# Patient Record
Sex: Female | Born: 1960 | Race: Black or African American | Hispanic: No | Marital: Married | Smoking: Never smoker
Health system: Southern US, Community
[De-identification: ages and names within clinical notes are randomized; demographics above are authoritative.]

## PROBLEM LIST (undated history)

## (undated) DIAGNOSIS — K219 Gastro-esophageal reflux disease without esophagitis: Secondary | ICD-10-CM

## (undated) HISTORY — DX: Gastro-esophageal reflux disease without esophagitis: K21.9

## (undated) HISTORY — PX: OTHER SURGICAL HISTORY: SHX169

---

## 2003-07-16 ENCOUNTER — Ambulatory Visit (HOSPITAL_COMMUNITY): Admission: RE | Admit: 2003-07-16 | Discharge: 2003-07-16 | Payer: Self-pay | Admitting: Family Medicine

## 2005-02-26 ENCOUNTER — Ambulatory Visit (HOSPITAL_COMMUNITY): Admission: RE | Admit: 2005-02-26 | Discharge: 2005-02-26 | Payer: Self-pay | Admitting: Family Medicine

## 2006-03-15 IMAGING — CR DG LUMBAR SPINE COMPLETE 4+V
5 series · 5 of 5 positions shown · non-contrast
Comparison: none

CLINICAL DATA: Low back and sacral pain for approximately two weeks.  No injury.  
 LUMBOSACRAL SPINE COMPLETE:
 Five views of the lumbosacral spine including both oblique, AP, and lateral views show five typical lumbar segments with no evidence of fracture or dislocation.  The posterior elements appear to be intact as far as can be seen.  The sacroiliac and hip joints appear normal as far as can be seen.

[view not recorded (1 of 5)]
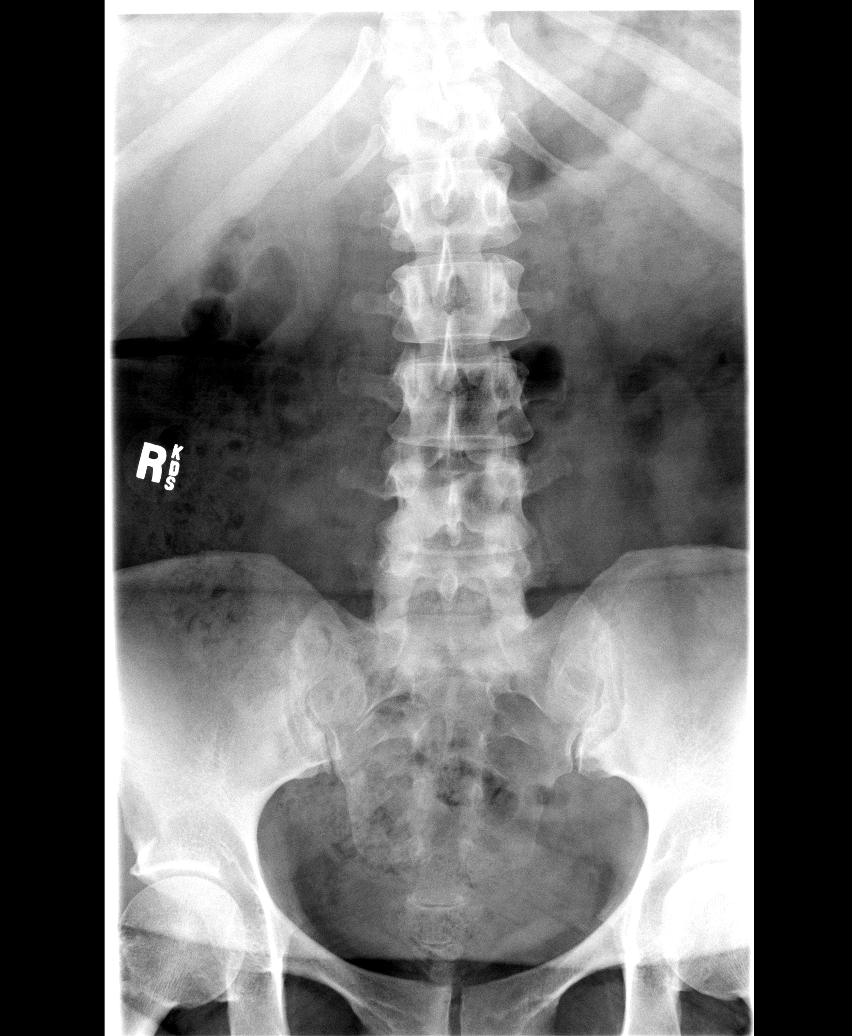

[view not recorded (2 of 5)]
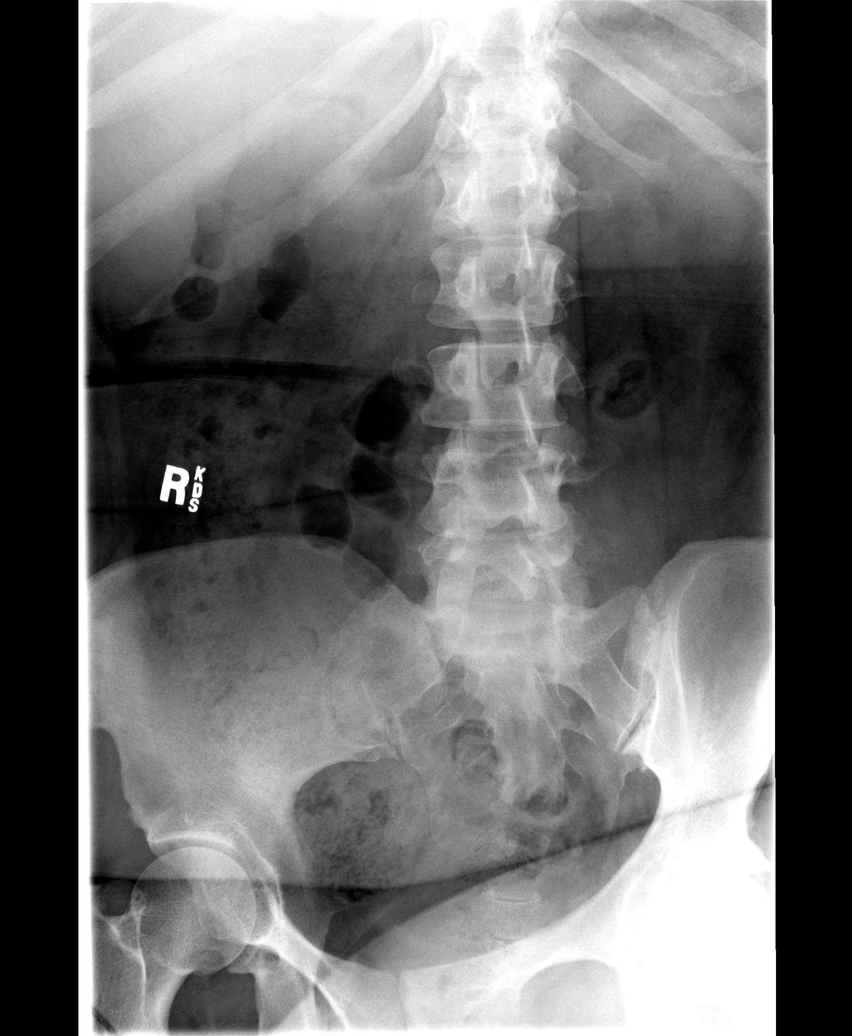

[view not recorded (3 of 5)]
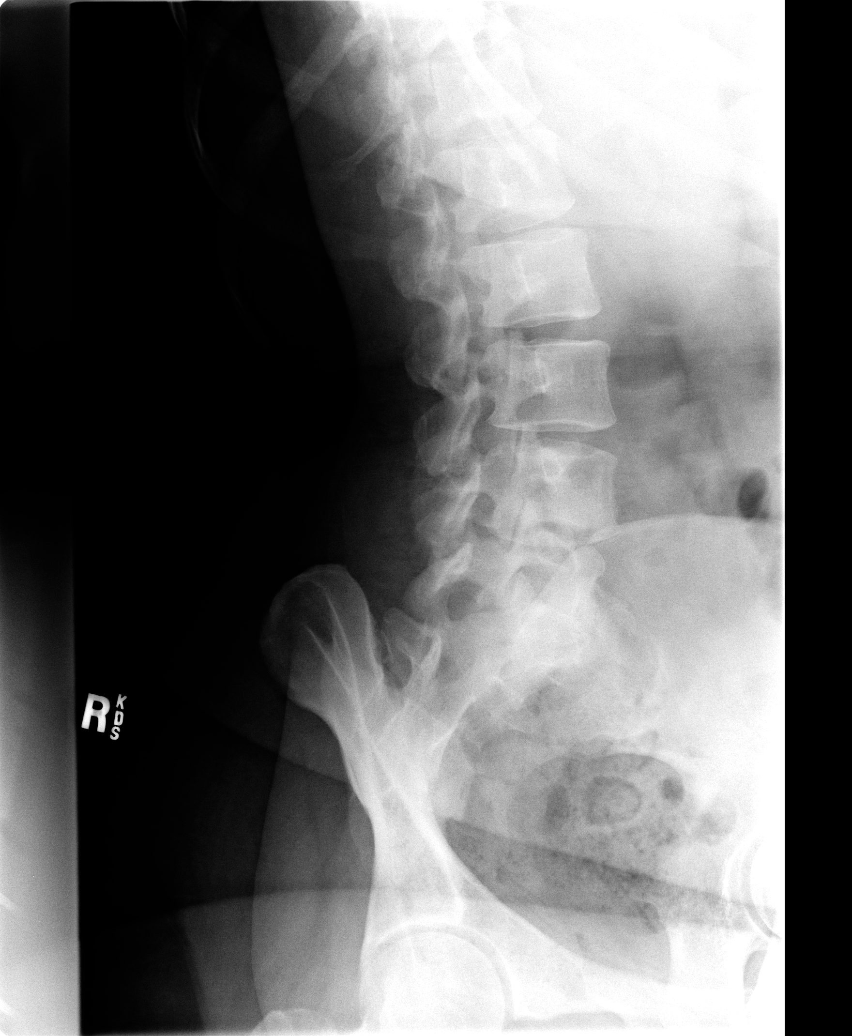

[view not recorded (4 of 5)]
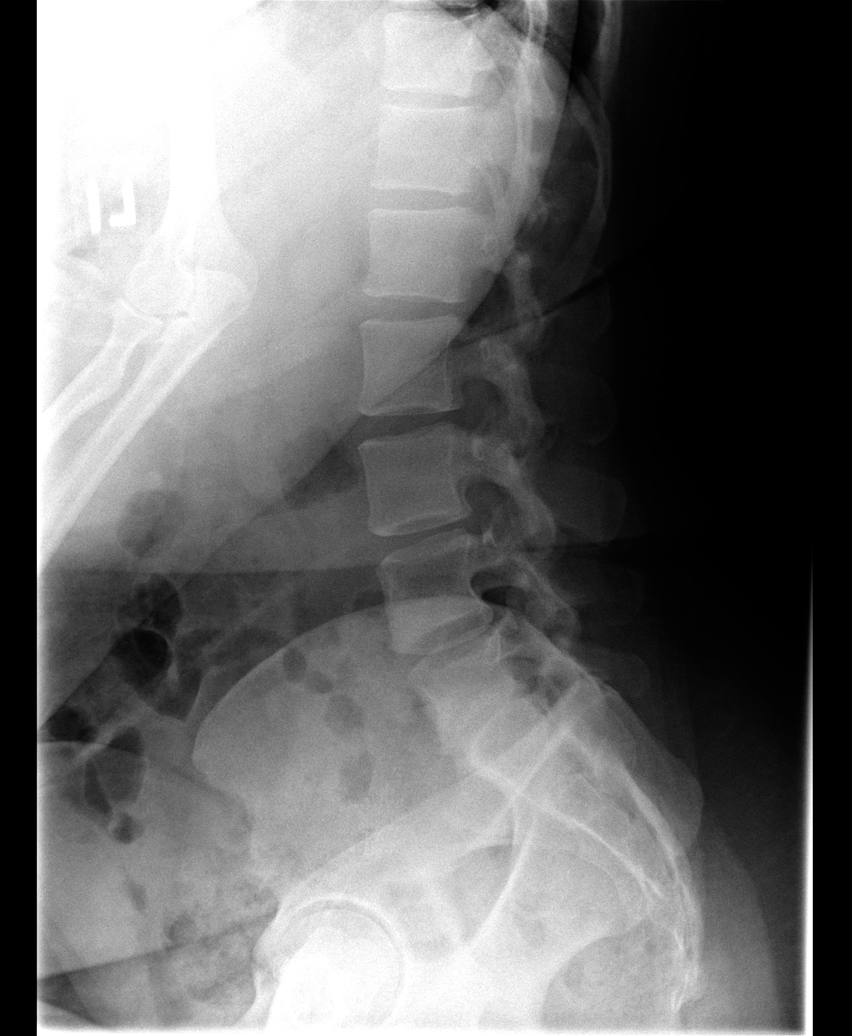

[view not recorded (5 of 5)]
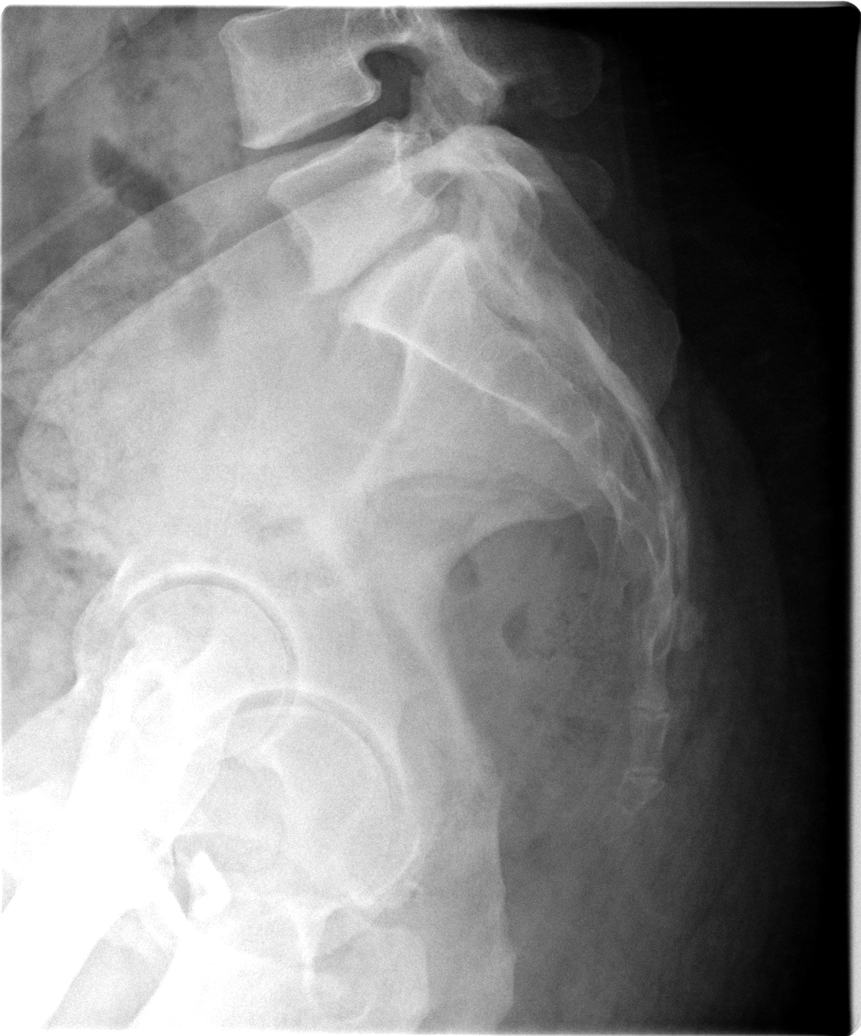

[5 of 5 positions shown; findings below may reference images not displayed]

IMPRESSION: Normal lumbosacral spine without previous films for comparison.

## 2011-02-20 ENCOUNTER — Encounter: Payer: Self-pay | Admitting: Gastroenterology

## 2011-03-01 ENCOUNTER — Ambulatory Visit: Payer: Self-pay | Admitting: Gastroenterology

## 2011-03-20 ENCOUNTER — Ambulatory Visit (INDEPENDENT_AMBULATORY_CARE_PROVIDER_SITE_OTHER): Payer: PRIVATE HEALTH INSURANCE | Admitting: Gastroenterology

## 2011-03-20 ENCOUNTER — Encounter: Payer: Self-pay | Admitting: Gastroenterology

## 2011-03-20 VITALS — BP 126/87 | HR 82 | Temp 96.7°F | Ht 65.0 in | Wt 260.2 lb

## 2011-03-20 DIAGNOSIS — R1319 Other dysphagia: Secondary | ICD-10-CM | POA: Insufficient documentation

## 2011-03-20 DIAGNOSIS — R1310 Dysphagia, unspecified: Secondary | ICD-10-CM | POA: Insufficient documentation

## 2011-03-20 DIAGNOSIS — Z1211 Encounter for screening for malignant neoplasm of colon: Secondary | ICD-10-CM | POA: Insufficient documentation

## 2011-03-20 DIAGNOSIS — R1314 Dysphagia, pharyngoesophageal phase: Secondary | ICD-10-CM

## 2011-03-20 DIAGNOSIS — K219 Gastro-esophageal reflux disease without esophagitis: Secondary | ICD-10-CM | POA: Insufficient documentation

## 2011-03-20 NOTE — Assessment & Plan Note (Signed)
Advised patient of new screening age of 59 for African Americans. She would like to postpone colonoscopy for now. She'll let us know when she is ready.

## 2011-03-20 NOTE — Progress Notes (Signed)
Primary Care Physician:  Harlow Asa, MD, MD  Primary Gastroenterologist:  Jonette Eva, MD  Chief Complaint  Patient presents with  . Dysphagia    intermittent for several years    HPI:  Stacy Olsen is a 50 y.o. female here several year history of intermittent solid food dysphagia. Dysphagia to meat. Feels fullness in center of chest. Forces it up with induced vomiting. Going on for few years and now more progressive. Citrus foods make it worse. Intermittent heartburn. Sometimes worse at night if eat late. Takes Prevacid 24-hour when necessary. BM regular. No melena, brbpr. No prior colonoscopy.    Current Outpatient Prescriptions  Medication Sig Dispense Refill  . acetaminophen (TYLENOL) 500 MG tablet Take 500 mg by mouth every 6 (six) hours as needed. As needed       . lansoprazole (PREVACID) 15 MG capsule Take 15 mg by mouth daily. As needed         Allergies as of 03/20/2011  . (No Known Allergies)    Past Medical History  Diagnosis Date  . GERD (gastroesophageal reflux disease)     Past Surgical History  Procedure Date  . None     Family History  Problem Relation Age of Onset  . Heart failure Mother   . Diabetes Mother   . Colon cancer Neg Hx   . Liver disease Neg Hx     History   Social History  . Marital Status: Married    Spouse Name: N/A    Number of Children: 0  . 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 Smoking status: Never Smoker   . Smokeless tobacco: Not on file  . Alcohol Use: 0.0 oz/week     mixed drinks occasionally  . Drug Use: No  . Sexually Active: Not on file   Other Topics Concern  . Not on file   Social History Narrative  . No narrative on file      ROS:  General: Negative for anorexia, weight loss, fever, chills, fatigue, weakness. Eyes: Negative for vision changes.  ENT: Negative for hoarseness. Recent nasal congestion resolved  post antibiotics. CV: Negative for chest pain, angina, palpitations, dyspnea on exertion, peripheral edema.  Respiratory: Negative for dyspnea at rest, dyspnea on exertion, cough, sputum, wheezing.  GI: See history of present illness. GU:  Negative for dysuria, hematuria, urinary incontinence, urinary frequency, nocturnal urination.  MS: Negative for joint pain, low back pain.  Derm: Negative for rash or itching.  Neuro: Negative for weakness, abnormal sensation, seizure, frequent headaches, memory loss, confusion.  Psych: Negative for anxiety, depression, suicidal ideation, hallucinations.  Endo: Negative for unusual weight change.  Heme: Negative for bruising or bleeding. Allergy: Negative for rash or hives.    Physical Examination:  BP 126/87  Pulse 82  Temp(Src) 96.7 F (35.9 C) (Tympanic)  Ht 5\' 5"  (1.651 m)  Wt 260 lb 3.2 oz (118.026 kg)  BMI 43.30 kg/m2   General: Well-nourished, well-developed in no acute distress. Obese. Head: Normocephalic, atraumatic.   Eyes: Conjunctiva pink, no icterus. Mouth: Oropharyngeal mucosa moist and pink , no lesions erythema or exudate. Neck: Supple without thyromegaly, masses, or lymphadenopathy.  Lungs: Clear to auscultation bilaterally.  Heart: Regular rate and rhythm, no murmurs rubs or gallops.  Abdomen: Bowel sounds are normal, nontender, nondistended, no hepatosplenomegaly or masses, no abdominal bruits or    hernia ,  no rebound or guarding.   Extremities: No lower extremity edema.  Neuro: Alert and oriented x 4 , grossly normal neurologically.  Skin: Warm and dry, no rash or jaundice.   Psych: Alert and cooperative, normal mood and affect.

## 2011-03-20 NOTE — Progress Notes (Signed)
Cc to PCP 

## 2011-03-20 NOTE — Assessment & Plan Note (Signed)
Solid food esophageal dysphagia likely due to ring or stricture. Recommend EGD with dilation in the near future. I have discussed the risks, alternatives, benefits with regards to but not limited to the risk of reaction to medication, bleeding, infection, perforation and the patient is agreeable to proceed. Written consent to be obtained.

## 2011-03-20 NOTE — Assessment & Plan Note (Signed)
Intermittent heartburn symptoms controlled on Prevacid over-the-counter when necessary.

## 2011-03-30 MED ORDER — SODIUM CHLORIDE 0.45 % IV SOLN
Freq: Once | INTRAVENOUS | Status: AC
  Administered 2011-04-02: 12:00:00 via INTRAVENOUS

## 2011-04-02 ENCOUNTER — Ambulatory Visit (HOSPITAL_COMMUNITY)
Admission: RE | Admit: 2011-04-02 | Discharge: 2011-04-02 | Disposition: A | Discharge status: Home / Self Care | Payer: 59 | Source: Ambulatory Visit | Attending: Gastroenterology | Admitting: Gastroenterology

## 2011-04-02 ENCOUNTER — Encounter: Payer: PRIVATE HEALTH INSURANCE | Admitting: Gastroenterology

## 2011-04-02 ENCOUNTER — Encounter (HOSPITAL_COMMUNITY)
Admission: RE | Disposition: A | Discharge status: Home / Self Care | Payer: Self-pay | Source: Ambulatory Visit | Attending: Gastroenterology

## 2011-04-02 ENCOUNTER — Other Ambulatory Visit: Payer: Self-pay | Admitting: Gastroenterology

## 2011-04-02 DIAGNOSIS — K2990 Gastroduodenitis, unspecified, without bleeding: Secondary | ICD-10-CM

## 2011-04-02 DIAGNOSIS — K219 Gastro-esophageal reflux disease without esophagitis: Secondary | ICD-10-CM | POA: Insufficient documentation

## 2011-04-02 DIAGNOSIS — K2970 Gastritis, unspecified, without bleeding: Secondary | ICD-10-CM

## 2011-04-02 DIAGNOSIS — R1310 Dysphagia, unspecified: Secondary | ICD-10-CM | POA: Insufficient documentation

## 2011-04-02 DIAGNOSIS — K2980 Duodenitis without bleeding: Secondary | ICD-10-CM | POA: Insufficient documentation

## 2011-04-02 DIAGNOSIS — K222 Esophageal obstruction: Secondary | ICD-10-CM

## 2011-04-02 DIAGNOSIS — K209 Esophagitis, unspecified without bleeding: Secondary | ICD-10-CM

## 2011-04-02 HISTORY — PX: ESOPHAGOGASTRODUODENOSCOPY: SHX5428

## 2011-04-02 HISTORY — PX: SAVORY DILATION: SHX5439

## 2011-04-02 SURGERY — EGD (ESOPHAGOGASTRODUODENOSCOPY)
Anesthesia: Moderate Sedation

## 2011-04-02 MED ORDER — BUTAMBEN-TETRACAINE-BENZOCAINE 2-2-14 % EX AERO
INHALATION_SPRAY | CUTANEOUS | Status: DC | PRN
  Administered 2011-04-02: 2 via TOPICAL

## 2011-04-02 MED ORDER — MINERAL OIL PO OIL
TOPICAL_OIL | ORAL | Status: AC
  Filled 2011-04-02: qty 30

## 2011-04-02 MED ORDER — MIDAZOLAM HCL 5 MG/5ML IJ SOLN
INTRAMUSCULAR | Status: DC | PRN
  Administered 2011-04-02 (×2): 1 mg via INTRAVENOUS
  Administered 2011-04-02 (×2): 2 mg via INTRAVENOUS
  Administered 2011-04-02: 1 mg via INTRAVENOUS

## 2011-04-02 MED ORDER — MEPERIDINE HCL 100 MG/ML IJ SOLN
INTRAMUSCULAR | Status: AC
  Filled 2011-04-02: qty 2

## 2011-04-02 MED ORDER — MEPERIDINE HCL 100 MG/ML IJ SOLN
INTRAMUSCULAR | Status: DC | PRN
  Administered 2011-04-02: 25 mg via INTRAVENOUS
  Administered 2011-04-02 (×2): 50 mg via INTRAVENOUS

## 2011-04-02 MED ORDER — LANSOPRAZOLE 30 MG PO CPDR: DELAYED_RELEASE_CAPSULE | ORAL | Status: DC

## 2011-04-02 MED ORDER — MIDAZOLAM HCL 5 MG/5ML IJ SOLN
INTRAMUSCULAR | Status: AC
  Filled 2011-04-02: qty 10

## 2011-04-02 NOTE — Interval H&P Note (Signed)
History and Physical Interval Note:   04/02/2011   11:46 AM   Stacy Olsen  has presented today for surgery, with the diagnosis of dysphagia, gerd  The various methods of treatment have been discussed with the patient and family. After consideration of risks, benefits and other options for treatment, the patient has consented to  Procedure(s): ESOPHAGOGASTRODUODENOSCOPY (EGD) MALONEY DILATION SAVORY DILATION as a surgical intervention .  I have reviewed the patients' chart and labs.  Questions were answered to the patient's satisfaction.     Jonette Eva  MD

## 2011-04-02 NOTE — H&P (Signed)
Diagnoses     Esophageal dysphagia   - Primary    787.24    GERD (gastroesophageal reflux disease)     530.81    Colon cancer screening     V76.51      Reason for Visit     Dysphagia    intermittent for several years        Current Vitals       Recorded User        03/20/2011  8:14 AM  Trudee Kuster, LPN           BP Pulse Temp (Src) Resp Ht Wt    126/87  82  96.7 F (35.9 C) (Tympanic)  N/A  5\' 5"  (1.651 m)  118.026 kg (260 lb 3.2 oz)       BMI SpO2 PF LMP    43.30 kg/m2  N/A  N/A  N/A          Progress Notes     Tana Coast, PA  03/20/2011  8:56 AM  Signed Primary Care Physician:  Harlow Asa, MD, MD   Primary Gastroenterologist:  Jonette Eva, MD    Chief Complaint   Patient presents with   .  Dysphagia       intermittent for several years      HPI:  Stacy Olsen is a 50 y.o. female here several year history of intermittent solid food dysphagia. Dysphagia to meat. Feels fullness in center of chest. Forces it up with induced vomiting. Going on for few years and now more progressive. Citrus foods make it worse. Intermittent heartburn. Sometimes worse at night if eat late. Takes Prevacid 24-hour when necessary. BM regular. No melena, brbpr. No prior colonoscopy.       Current Outpatient Prescriptions   Medication  Sig  Dispense  Refill   .  acetaminophen (TYLENOL) 500 MG tablet  Take 500 mg by mouth every 6 (six) hours as needed. As needed          .  lansoprazole (PREVACID) 15 MG capsule  Take 15 mg by mouth daily. As needed              Allergies as of 03/20/2011   .  (No Known Allergies)       Past Medical History   Diagnosis  Date   .  GERD (gastroesophageal reflux disease)         Past Surgical History   Procedure  Date   .  None         Family History   Problem  Relation  Age of Onset   .  Heart failure  Mother     .  Diabetes  Mother     .  Colon cancer  Neg Hx     .  Liver disease  Neg Hx         History       Social  History   .  Marital Status:  Married       Spouse Name:  N/A       Number of Children:  0   .  Years of Education:  N/A       Occupational History   .  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  Smoking status:  Never Smoker    .  Smokeless tobacco:  Not on file   .  Alcohol Use:  0.0 oz/week         mixed drinks occasionally   .  Drug Use:  No   .  Sexually Active:  Not on file       Other Topics  Concern   .  Not on file       Social History Narrative   .  No narrative on file        ROS:   General: Negative for anorexia, weight loss, fever, chills, fatigue, weakness. Eyes: Negative for vision changes.   ENT: Negative for hoarseness. Recent nasal congestion resolved post antibiotics. CV: Negative for chest pain, angina, palpitations, dyspnea on exertion, peripheral edema.   Respiratory: Negative for dyspnea at rest, dyspnea on exertion, cough, sputum, wheezing.   GI: See history of present illness. GU:  Negative for dysuria, hematuria, urinary incontinence, urinary frequency, nocturnal urination.   MS: Negative for joint pain, low back pain.   Derm: Negative for rash or itching.   Neuro: Negative for weakness, abnormal sensation, seizure, frequent headaches, memory loss, confusion.   Psych: Negative for anxiety, depression, suicidal ideation, hallucinations.   Endo: Negative for unusual weight change.   Heme: Negative for bruising or bleeding. Allergy: Negative for rash or hives.     Physical Examination:   BP 126/87  Pulse 82  Temp(Src) 96.7 F (35.9 C) (Tympanic)  Ht 5\' 5"  (1.651 m)  Wt 260 lb 3.2 oz (118.026 kg)  BMI 43.30 kg/m2    General: Well-nourished, well-developed in no acute distress. Obese. Head: Normocephalic, atraumatic.    Eyes: Conjunctiva pink, no icterus. Mouth: Oropharyngeal mucosa moist and pink , no lesions erythema or exudate. Neck: Supple without thyromegaly, masses, or  lymphadenopathy.   Lungs: Clear to auscultation bilaterally.   Heart: Regular rate and rhythm, no murmurs rubs or gallops.   Abdomen: Bowel sounds are normal, nontender, nondistended, no hepatosplenomegaly or masses, no abdominal bruits or    hernia , no rebound or guarding.    Extremities: No lower extremity edema.   Neuro: Alert and oriented x 4 , grossly normal neurologically.   Skin: Warm and dry, no rash or jaundice.    Psych: Alert and cooperative, normal mood and affect.          Glendora Score  03/20/2011 11:39 AM  Signed Cc to PCP        Esophageal dysphagia - Tana Coast, PA  03/20/2011  8:39 AM  Signed Solid food esophageal dysphagia likely due to ring or stricture. Recommend EGD with dilation in the near future. I have discussed the risks, alternatives, benefits with regards to but not limited to the risk of reaction to medication, bleeding, infection, perforation and the patient is agreeable to proceed. Written consent to be obtained.     GERD (gastroesophageal reflux disease) - Tana Coast, PA  03/20/2011  8:39 AM  Signed Intermittent heartburn symptoms controlled on Prevacid over-the-counter when necessary.  Colon cancer screening - Tana Coast, PA  03/20/2011  8:40 AM  Signed Advised patient of new screening age of 73 for African Americans. She would like to postpone colonoscopy for now. She'll let us know when she is ready.

## 2011-04-04 ENCOUNTER — Telehealth: Payer: Self-pay | Admitting: Gastroenterology

## 2011-04-04 NOTE — Telephone Encounter (Signed)
Please call pt. Her biopsy of the stomach showed gastritis. Continue BID Prevacid. EGD/dil in AUG 2012. OPV in [facility] 2012.

## 2011-04-04 NOTE — Telephone Encounter (Signed)
EGD/an f/u OPV is in the computer

## 2011-04-04 NOTE — Telephone Encounter (Signed)
Cc path to PCP 

## 2011-04-05 NOTE — Telephone Encounter (Signed)
LMOM to call.

## 2011-04-05 NOTE — Progress Notes (Signed)
Agree. 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

## 2011-04-09 ENCOUNTER — Encounter (HOSPITAL_COMMUNITY): Payer: Self-pay | Admitting: Gastroenterology

## 2011-04-09 NOTE — Telephone Encounter (Signed)
Pt informed. York Spaniel she will be taking a cruise week of Aug. 16-21. Please schedule a different week.

## 2011-04-09 NOTE — Telephone Encounter (Signed)
LMOM to call.

## 2011-05-08 ENCOUNTER — Telehealth: Payer: Self-pay

## 2011-05-09 ENCOUNTER — Telehealth: Payer: Self-pay

## 2011-05-09 NOTE — Telephone Encounter (Signed)
LMOM to call. Need to reschedule EGD.

## 2011-05-09 NOTE — Telephone Encounter (Signed)
Mailing a letter to call.  

## 2011-05-09 NOTE — Telephone Encounter (Signed)
LMOM to call. Mailing a letter also.

## 2011-06-08 ENCOUNTER — Telehealth: Payer: Self-pay

## 2011-06-08 NOTE — Telephone Encounter (Signed)
Pt called and asked for written prescription for a 3 month supply of Lansoprazole, and she would pick up the Rx. After reviewing her chart, she was supposed to have been scheduled for an EGD, I called and LMOM for her to call for OV and she could get the Prescription written at her OV.

## 2011-06-11 NOTE — Telephone Encounter (Signed)
Noted  

## 2011-06-11 NOTE — Telephone Encounter (Signed)
LMOM at home again that she needs to schedule OV. (Work Number incorrect )

## 2011-06-25 ENCOUNTER — Encounter: Payer: Self-pay | Admitting: Gastroenterology

## 2011-06-25 ENCOUNTER — Ambulatory Visit (INDEPENDENT_AMBULATORY_CARE_PROVIDER_SITE_OTHER): Payer: 59 | Admitting: Gastroenterology

## 2011-06-25 ENCOUNTER — Telehealth: Payer: Self-pay

## 2011-06-25 VITALS — BP 122/81 | HR 82 | Temp 97.9°F | Ht 65.0 in | Wt 265.2 lb

## 2011-06-25 DIAGNOSIS — R1319 Other dysphagia: Secondary | ICD-10-CM

## 2011-06-25 DIAGNOSIS — Z1211 Encounter for screening for malignant neoplasm of colon: Secondary | ICD-10-CM

## 2011-06-25 DIAGNOSIS — R1314 Dysphagia, pharyngoesophageal phase: Secondary | ICD-10-CM

## 2011-06-25 DIAGNOSIS — R1310 Dysphagia, unspecified: Secondary | ICD-10-CM

## 2011-06-25 NOTE — Progress Notes (Signed)
Primary Care Physician: Harlow Asa, MD, MD  Primary Gastroenterologist:  Jonette Eva, MD   Chief Complaint  Patient presents with  . Medication Refill    HPI: Stacy Olsen is a 50 y.o. female here for f/u. EGD/ED done in 03/2011. She had distal esophagitis with stricture, gastritis/duodenitis/small gastric ulcers. Bx showed reactive gastropathy likely secondary to nsaids. Patient denies further ASA powders. Aleve geltab about twice per week. Does not want another EGD right now. Still paying off bill. States she has no further problems swallowing. No heartburn. No n/v, abd pain, constipation, diarrhea, melena, brbpr.   Current Outpatient Prescriptions  Medication Sig Dispense Refill  . acetaminophen (TYLENOL) 500 MG tablet Take 500 mg by mouth every 6 (six) hours as needed. As needed       . lansoprazole (PREVACID) 30 MG capsule 1 po 30 minutes prior to meals twice daily  60 capsule  11    Allergies as of 06/25/2011  . (No Known Allergies)    ROS:  General: Negative for anorexia, weight loss, fever, chills, fatigue, weakness. ENT: Negative for hoarseness, difficulty swallowing , nasal congestion. CV: Negative for chest pain, angina, palpitations, dyspnea on exertion, peripheral edema.  Respiratory: Negative for dyspnea at rest, dyspnea on exertion, cough, sputum, wheezing.  GI: See history of present illness. GU:  Negative for dysuria, hematuria, urinary incontinence, urinary frequency, nocturnal urination.  Endo: Negative for unusual weight change.    Physical Examination:   BP 122/81  Pulse 82  Temp(Src) 97.9 F (36.6 C) (Temporal)  Ht 5\' 5"  (1.651 m)  Wt 265 lb 3.2 oz (120.294 kg)  BMI 44.13 kg/m2  General: Well-nourished, well-developed in no acute distress.  Eyes: No icterus. Mouth: Oropharyngeal mucosa moist and pink , no lesions erythema or exudate. Lungs: Clear to auscultation bilaterally.  Heart: Regular rate and rhythm, no murmurs rubs or gallops.    Abdomen: Bowel sounds are normal, nontender, nondistended, no hepatosplenomegaly or masses, no abdominal bruits or hernia , no rebound or guarding.   Extremities: No lower extremity edema. No clubbing or deformities. Neuro: Alert and oriented x 4   Skin: Warm and dry, no jaundice.   Psych: Alert and cooperative, normal mood and affect.

## 2011-06-25 NOTE — Patient Instructions (Addendum)
Please call us with your mail-order information to send off for Prevacid.  Avoid NSAIDS as much as possible. Office visit with Dr. Darrick Penna in 3 months.

## 2011-06-25 NOTE — Telephone Encounter (Signed)
Pt called and said the phone number for her mail order medications is [phone].

## 2011-06-25 NOTE — Assessment & Plan Note (Signed)
Clinically doing well. On double dose prevacid for now. Plan was for repeat dilation in 04/2011. Patient declines at this time. She denies difficulty swallowing. States all of her symptoms have resolved. Continue PPI BID for three months. OV with Dr. Darrick Penna in three months. She should call sooner if she has recurrent dysphagia.

## 2011-06-27 MED ORDER — LANSOPRAZOLE 30 MG PO CPDR: DELAYED_RELEASE_CAPSULE | ORAL | Status: DC

## 2011-06-27 NOTE — Progress Notes (Signed)
Cc to PCP 

## 2011-06-27 NOTE — Telephone Encounter (Signed)
RX done, please fax.

## 2011-06-27 NOTE — Telephone Encounter (Signed)
Faxed Rx

## 2011-07-26 ENCOUNTER — Encounter: Payer: Self-pay | Admitting: Gastroenterology

## 2011-08-09 NOTE — Progress Notes (Signed)
REVIEWED.  

## 2011-09-10 ENCOUNTER — Encounter: Payer: Self-pay | Admitting: Gastroenterology

## 2013-09-24 ENCOUNTER — Encounter (HOSPITAL_COMMUNITY): Payer: Self-pay | Admitting: Emergency Medicine

## 2013-09-24 ENCOUNTER — Emergency Department (HOSPITAL_COMMUNITY)
Admission: EM | Admit: 2013-09-24 | Discharge: 2013-09-24 | Disposition: A | Discharge status: Home / Self Care | Payer: No Typology Code available for payment source | Attending: Emergency Medicine | Admitting: Emergency Medicine

## 2013-09-24 ENCOUNTER — Emergency Department (HOSPITAL_COMMUNITY): Payer: No Typology Code available for payment source

## 2013-09-24 DIAGNOSIS — IMO0002 Reserved for concepts with insufficient information to code with codable children: Secondary | ICD-10-CM | POA: Insufficient documentation

## 2013-09-24 DIAGNOSIS — Z79899 Other long term (current) drug therapy: Secondary | ICD-10-CM | POA: Insufficient documentation

## 2013-09-24 DIAGNOSIS — K219 Gastro-esophageal reflux disease without esophagitis: Secondary | ICD-10-CM | POA: Insufficient documentation

## 2013-09-24 DIAGNOSIS — S161XXA Strain of muscle, fascia and tendon at neck level, initial encounter: Secondary | ICD-10-CM

## 2013-09-24 DIAGNOSIS — S139XXA Sprain of joints and ligaments of unspecified parts of neck, initial encounter: Secondary | ICD-10-CM | POA: Insufficient documentation

## 2013-09-24 DIAGNOSIS — Y92410 Unspecified street and highway as the place of occurrence of the external cause: Secondary | ICD-10-CM | POA: Insufficient documentation

## 2013-09-24 DIAGNOSIS — Y9389 Activity, other specified: Secondary | ICD-10-CM | POA: Insufficient documentation

## 2013-09-24 MED ORDER — CYCLOBENZAPRINE HCL 10 MG PO TABS: 10.0000 mg | ORAL_TABLET | Freq: Two times a day (BID) | ORAL | Status: AC | PRN

## 2013-09-24 MED ORDER — HYDROCODONE-ACETAMINOPHEN 5-325 MG PO TABS: 2.0000 | ORAL_TABLET | ORAL | Status: AC | PRN
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
## 2013-09-24 NOTE — Discharge Instructions (Signed)
Cervical Sprain A cervical sprain is an injury in the neck in which the strong, fibrous tissues (ligaments) that connect your neck bones stretch or tear. Cervical sprains can range from mild to severe. Severe cervical sprains can cause the neck vertebrae to be unstable. This can lead to damage of the spinal cord and can result in serious nervous system problems. The amount of time it takes for a cervical sprain to get better depends on the cause and extent of the injury. Most cervical sprains heal in 1 to 3 weeks. CAUSES  Severe cervical sprains may be caused by:   Contact sport injuries (such as from football, rugby, wrestling, hockey, auto racing, gymnastics, diving, martial arts, or boxing).   Motor vehicle collisions.   Whiplash injuries. This is an injury from a sudden forward-and backward whipping movement of the head and neck.  Falls.  Mild cervical sprains may be caused by:   Being in an awkward position, such as while cradling a telephone between your ear and shoulder.   Sitting in a chair that does not offer proper support.   Working at a poorly designed computer station.   Looking up or down for long periods of time.  SYMPTOMS   Pain, soreness, stiffness, or a burning sensation in the front, back, or sides of the neck. This discomfort may develop immediately after the injury or slowly, 24 hours or more after the injury.   Pain or tenderness directly in the middle of the back of the neck.   Shoulder or upper back pain.   Limited ability to move the neck.   Headache.   Dizziness.   Weakness, numbness, or tingling in the hands or arms.   Muscle spasms.   Difficulty swallowing or chewing.   Tenderness and swelling of the neck.  DIAGNOSIS  Most of the time your health care provider can diagnose a cervical sprain by taking your history and doing a physical exam. Your health care provider will ask about previous neck injuries and any known neck  problems, such as arthritis in the neck. X-rays may be taken to find out if there are any other problems, such as with the bones of the neck. Other tests, such as a CT scan or MRI, may also be needed.  TREATMENT  Treatment depends on the severity of the cervical sprain. Mild sprains can be treated with rest, keeping the neck in place (immobilization), and pain medicines. Severe cervical sprains are immediately immobilized. Further treatment is done to help with pain, muscle spasms, and other symptoms and may include:  Medicines, such as pain relievers, numbing medicines, or muscle relaxants.   Physical therapy. This may involve stretching exercises, strengthening exercises, and posture training. Exercises and improved posture can help stabilize the neck, strengthen muscles, and help stop symptoms from returning.  HOME CARE INSTRUCTIONS   Put ice on the injured area.   Put ice in a plastic bag.   Place a towel between your skin and the bag.   Leave the ice on for 15 20 minutes, 3 4 times a day.   If your injury was severe, you may have been given a cervical collar to wear. A cervical collar is a two-piece collar designed to keep your neck from moving while it heals.  Do not remove the collar unless instructed by your health care provider.  If you have long hair, keep it outside of the collar.  Ask your health care provider before making any adjustments to your collar.   Minor adjustments may be required over time to improve comfort and reduce pressure on your chin or on the back of your head.  Ifyou are allowed to remove the collar for cleaning or bathing, follow your health care provider's instructions on how to do so safely.  Keep your collar clean by wiping it with mild soap and water and drying it completely. If the collar you have been given includes removable pads, remove them every 1 2 days and hand wash them with soap and water. Allow them to air dry. They should be completely  dry before you wear them in the collar.  If you are allowed to remove the collar for cleaning and bathing, wash and dry the skin of your neck. Check your skin for irritation or sores. If you see any, tell your health care provider.  Do not drive while wearing the collar.   Only take over-the-counter or prescription medicines for pain, discomfort, or fever as directed by your health care provider.   Keep all follow-up appointments as directed by your health care provider.   Keep all physical therapy appointments as directed by your health care provider.   Make any needed adjustments to your workstation to promote good posture.   Avoid positions and activities that make your symptoms worse.   Warm up and stretch before being active to help prevent problems.  SEEK MEDICAL CARE IF:   Your pain is not controlled with medicine.   You are unable to decrease your pain medicine over time as planned.   Your activity level is not improving as expected.  SEEK IMMEDIATE MEDICAL CARE IF:   You develop any bleeding.  You develop stomach upset.  You have signs of an allergic reaction to your medicine.   Your symptoms get worse.   You develop new, unexplained symptoms.   You have numbness, tingling, weakness, or paralysis in any part of your body.  MAKE SURE YOU:   Understand these instructions.  Will watch your condition.  Will get help right away if you are not doing well or get worse. Document Released: 06/24/2007 Document Revised: 06/17/2013 Document Reviewed: 03/04/2013 [employer] Patient Information 2014 [employer].  
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
## 2017-11-08 DIAGNOSIS — J0100 Acute maxillary sinusitis, unspecified: Secondary | ICD-10-CM | POA: Diagnosis not present

## 2017-11-08 DIAGNOSIS — Z1389 Encounter for screening for other disorder: Secondary | ICD-10-CM | POA: Diagnosis not present

## 2017-11-08 DIAGNOSIS — Z6841 Body Mass Index (BMI) 40.0 and over, adult: Secondary | ICD-10-CM | POA: Diagnosis not present

## 2017-11-08 DIAGNOSIS — J3089 Other allergic rhinitis: Secondary | ICD-10-CM | POA: Diagnosis not present

## 2018-03-05 DIAGNOSIS — Z6841 Body Mass Index (BMI) 40.0 and over, adult: Secondary | ICD-10-CM | POA: Diagnosis not present

## 2018-03-05 DIAGNOSIS — Z1389 Encounter for screening for other disorder: Secondary | ICD-10-CM | POA: Diagnosis not present

## 2018-03-05 DIAGNOSIS — J301 Allergic rhinitis due to pollen: Secondary | ICD-10-CM | POA: Diagnosis not present

## 2018-03-05 DIAGNOSIS — J019 Acute sinusitis, unspecified: Secondary | ICD-10-CM | POA: Diagnosis not present

## 2019-11-08 ENCOUNTER — Other Ambulatory Visit: Payer: Self-pay

## 2019-11-08 ENCOUNTER — Ambulatory Visit: Payer: PRIVATE HEALTH INSURANCE | Attending: Internal Medicine

## 2019-11-08 DIAGNOSIS — Z23 Encounter for immunization: Secondary | ICD-10-CM | POA: Insufficient documentation

## 2019-11-08 NOTE — Progress Notes (Signed)
   Covid-19 Vaccination Clinic  Name:  Stacy Olsen    MRN: [id number] DOB: 1961-08-15  11/08/2019  Stacy Olsen was observed post Covid-19 immunization for 15 minutes without incidence. She was provided with Vaccine Information Sheet and instruction to access the V-Safe system.   Stacy Olsen was instructed to call 911 with any severe reactions post vaccine: Marland Kitchen Difficulty breathing  . Swelling of your face and throat  . A fast heartbeat  . A bad rash all over your body  . Dizziness and weakness    Immunizations Administered    Name Date Dose VIS Date Route   Moderna COVID-19 Vaccine 11/08/2019  2:26 PM 0.5 mL 08/11/2019 Intramuscular   Manufacturer: Moderna   Lot: [id number]   NDC: 27614-709-29

## 2019-12-12 ENCOUNTER — Ambulatory Visit: Payer: PRIVATE HEALTH INSURANCE | Attending: Internal Medicine

## 2019-12-12 DIAGNOSIS — Z23 Encounter for immunization: Secondary | ICD-10-CM

## 2019-12-12 NOTE — Progress Notes (Signed)
   Covid-19 Vaccination Clinic  Name:  Stacy Olsen    MRN: [id number] DOB: 14-Jun-1961  12/12/2019  Stacy Olsen was observed post Covid-19 immunization for 15 minutes without incident. She was provided with Vaccine Information Sheet and instruction to access the V-Safe system.   Stacy Olsen was instructed to call 911 with any severe reactions post vaccine: Marland Kitchen Difficulty breathing  . Swelling of face and throat  . A fast heartbeat  . A bad rash all over body  . Dizziness and weakness   Immunizations Administered    Name Date Dose VIS Date Route   Moderna COVID-19 Vaccine 12/12/2019  9:43 AM 0.5 mL 08/11/2019 Intramuscular   Manufacturer: Moderna   Lot: [id number]   NDC: 00923-300-76

## 2021-03-24 DIAGNOSIS — Z20822 Contact with and (suspected) exposure to covid-19: Secondary | ICD-10-CM | POA: Diagnosis not present

## 2021-03-24 DIAGNOSIS — Z1152 Encounter for screening for COVID-19: Secondary | ICD-10-CM | POA: Diagnosis not present

## 2021-10-09 DIAGNOSIS — J309 Allergic rhinitis, unspecified: Secondary | ICD-10-CM | POA: Diagnosis not present

## 2021-10-09 DIAGNOSIS — J329 Chronic sinusitis, unspecified: Secondary | ICD-10-CM | POA: Diagnosis not present

## 2023-11-21 ENCOUNTER — Ambulatory Visit (INDEPENDENT_AMBULATORY_CARE_PROVIDER_SITE_OTHER)

## 2023-11-21 ENCOUNTER — Ambulatory Visit (INDEPENDENT_AMBULATORY_CARE_PROVIDER_SITE_OTHER): Payer: Self-pay | Admitting: Podiatry

## 2023-11-21 ENCOUNTER — Encounter: Payer: Self-pay | Admitting: Podiatry

## 2023-11-21 DIAGNOSIS — M21619 Bunion of unspecified foot: Secondary | ICD-10-CM | POA: Diagnosis not present

## 2023-11-21 DIAGNOSIS — M7751 Other enthesopathy of right foot: Secondary | ICD-10-CM

## 2023-11-21 DIAGNOSIS — M2041 Other hammer toe(s) (acquired), right foot: Secondary | ICD-10-CM

## 2023-11-21 DIAGNOSIS — M79671 Pain in right foot: Secondary | ICD-10-CM

## 2023-11-21 MED ORDER — CELECOXIB 100 MG PO CAPS: 100.0000 mg | ORAL_CAPSULE | Freq: Two times a day (BID) | ORAL | 1 refills | Status: DC

## 2023-11-21 NOTE — Patient Instructions (Signed)
 You can use a metatarsal pad to help take the pressure off.   --  Hammer Toe Hammer toe is a change in the shape, or a deformity, of the toe. The deformity causes the middle joint of the toe to stay bent. Hammer toe starts gradually. At first, the toe can be straightened. Then over time, the toe deformity becomes stiff, inflexible, and permanently bent. Hammer toe usually affects the second, third, or fourth toe. A hammer toe causes pain, especially when wearing shoes. Corns and calluses can result from the toe rubbing against the inside of the shoe. Early treatments to keep the toe straight may relieve pain. As the deformity of the toe becomes stiff and permanent, surgery may be needed to straighten the toe. What are the causes? This condition is caused by abnormal bending of the toe joint that is closest to your foot. Over time, the toe bending downward pulls on the muscles and connections (tendons) of the toe joint, making them weak and stiff. Wearing shoes that are too narrow in the toe box and do not allow toes to fully straighten can cause this condition. What increases the risk? You are more likely to develop this condition if you: Are an older female. Wear shoes that are too small, or wear high-heeled shoes that pinch your toes. Have a second toe that is longer than your big toe (first toe). Injure your foot or toe. Have arthritis, or have a nerve or muscle disorder. Have diabetes or a condition known as Charcot joint, which may cause you to walk abnormally. Have a family history of hammer toe. Are a Advertising account planner. What are the signs or symptoms? Pain and deformity of the toe are the main symptoms of this condition. The pain is worse when wearing shoes, walking, or running. Other symptoms may include: A thickened patch of skin, called a corn or callus, that forms over the top of the bent part of the toe or between the toes. Redness and a burning feeling on the bent toe. An open sore  that forms on the top of the bent toe. Not being able to straighten the affected toe. How is this diagnosed? This condition is diagnosed based on your symptoms and a physical exam. During the exam, your health care provider will try to straighten your toe to see how stiff the deformity is. You may also have tests, such as: A blood test to check for rheumatoid arthritis or diabetes. An X-ray to show how severe the toe deformity is. How is this treated? Treatment for this condition depends on whether the toe is flexible or deformed and no longer moveable. In less severe cases, a hammer toe can be straightened without surgery. These treatments include: Taping the toe into a straightened position. Using pads and cushions to protect the bent toe. Wearing shoes that provide enough room for the toes. Doing toe-stretching exercises at home. Taking an NSAID, such as ibuprofen, to reduce pain and swelling. Using special orthotics or insoles for pain relief and to improve walking. If these treatments do not help or the toe has a severe deformity and cannot be straightened, surgery is the next option. The most common surgeries used to straighten a hammer toe include: Arthroplasty or osteotomy. Part of the toe joint is reconstructed or removed, which allows the toe to straighten. Fusion. Cartilage between the two bones of the joint is taken out, and the bones are fused together into one longer bone. Implantation. Part of the bone is  removed and replaced with an implant to allow the toe to move again. Flexor tendon transfer. The tendons that curl the toes down (flexor tendons) are repositioned. Follow these instructions at home: Take over-the-counter and prescription medicines only as told by your health care provider. Do toe-straightening and stretching exercises as told by your health care provider. Keep all follow-up visits. This is important. How is this prevented? Wear shoes that fit properly and give  your toes enough room. Shoes should not cause pain. Buy shoes at the end of the day to make sure they fit well, since your foot may swell during the day. Make sure they are comfortable before you buy them. As you age, your shoe size might change, including the width. Measure both feet and buy shoes for the larger foot. A shoe repair store might be able to stretch shoes that feel tight in spots. Do not wear high-heeled shoes or shoes with pointed toes. Contact a health care provider if: Your pain gets worse. Your toe becomes red or swollen. You develop an open sore on your toe. Summary Hammer toe is a condition that gradually causes your toe to become bent and stiff. Hammer toe can be treated by taping the toe into a straightened position and doing toe-stretching exercises. If these treatments do not help, surgery may be needed. To prevent this condition, wear shoes that fit properly, give your toes enough room, and do not cause pain. This information is not intended to replace advice given to you by your health care provider. Make sure you discuss any questions you have with your health care provider. Document Revised: 12/02/2019 Document Reviewed: 12/03/2019 Elsevier Patient Education  2024 [employer]  --  Bunion: What to Know A bunion, or hallux valgus, is a bump that forms slowly on the inner side of your big toe joint. It happens when your big toe turns toward your second toe. Bunions may be small at first but get bigger over time. They can make walking painful. What are the causes? A bunion may be caused by: Wearing narrow or pointed shoes that force your big toe to press against the other toes. Problems with how your foot is shaped. Changes in your foot caused by some diseases or conditions. A foot injury. What increases the risk? You're more likely to get a bunion if: You wear shoes that squeeze your toes. You have certain diseases, such as: Rheumatoid arthritis. Cerebral  palsy. Someone in your family gets bunions too. You have flat feet or low arches. You do things that put a lot of pressure on your feet, such as ballet. What are the signs or symptoms? The main symptom is a bump on the inner side of your big toe. You may also have: Pain. Redness and swelling around your big toe. Thick or hard skin on your big toe or between your toes. Stiffness or loss of movement in your big toe. Trouble walking. How is this diagnosed? A bunion may be diagnosed based on your symptoms, medical history, and activities.  You may also have tests, such as an X-ray. This helps your health care provider see the bones in your foot and look for damage to your joint. How is this treated? Treatment can help with symptoms and can stop the bunion from getting worse. What you need to do may depend on how bad your symptoms are. You may need to: Wear shoes that have a wide toe box. Use bunion pads to cushion your toes. Tape  your toes together. Place an insert called an orthotic device in your shoe. This can help take pressure off your toe joint. Take medicine to help with pain and swelling. Put ice or heat on your foot. Do stretching exercises. Have surgery. You may need this if the bunion is causing very bad symptoms. Follow these instructions at home: Managing pain, stiffness, and swelling     Use ice or an ice pack as told. Place a towel between your skin and the ice. Leave the ice on for 20 minutes, 2-3 times a day. Use heat as told. Use the heat source that your provider recommends, such as a moist heat pack or a heating pad. Do this as often as told. Place a towel between your skin and the heat source. Leave the heat on for 20-30 minutes. If your skin turns red, take off the ice or heat right away to prevent skin damage. The risk of damage is higher if you can't feel pain, heat, or cold. General instructions Exercise as told. Support your toe joint as told with: The  right footwear. Shoe padding. Taping. Wear shoes that have a wide toe box. Avoid wearing tight shoes or shoes with high heels. Take your medicines only as told. Do not smoke, vape, or use nicotine or tobacco. Keep all follow-up visits. Your provider will check if the treatments are working. Contact a health care provider if: Your symptoms get worse. Your symptoms don't get better in 2 weeks. Get help right away if: You have very bad pain and trouble walking. This information is not intended to replace advice given to you by your health care provider. Make sure you discuss any questions you have with your health care provider. Document Revised: 03/15/2023 Document Reviewed: 03/15/2023 Elsevier Patient Education  2024 [employer].

## 2023-11-23 NOTE — Progress Notes (Signed)
 Subjective:  Patient ID: Stacy Olsen, female    DOB: 1961/02/14,  MRN: [id number]  Chief Complaint  Patient presents with   Foot Pain    Rm#14 Right foot pain second toe swollen foot hurting on the ball of foot no injuries that she recalls.Symptoms ongoing for 1 month nothing is helping.     Discussed the use of AI scribe software for clinical note transcription with the patient, who gave verbal consent to proceed.  History of Present Illness          The patient presents with right foot pain and swelling, particularly in the second toe.  She has been experiencing right foot pain and swelling, particularly in the second toe, for over a month. The pain is exacerbated by pressure, such as standing up from the bathtub, and is noticeable when wearing closed shoes. She has been trying to wear wider shoes to alleviate the discomfort.  The swelling on the top of the foot has decreased, but tenderness persists across the middle bottom of the foot. The pain is more pronounced at night and when wearing shoes or walking. She occasionally takes ibuprofen for the pain, which provides slight relief.  She has not received any formal treatment for this issue prior to this visit. The pain is not constant but is triggered by activities like walking or wearing shoes that apply pressure to the affected area.  No known injuries to the foot, but she speculates that her standing posture might contribute to the issue. No pain is reported in the left foot.  No allergies to medications except for Zyrtec and Allegra, and she is not on any medication for high blood pressure.   Objective:    Physical Exam         General: AAO x3, NAD  Dermatological: Skin is warm, dry and supple bilateral. There are no open sores, no preulcerative lesions, no rash or signs of infection present.  Vascular: Dorsalis Pedis artery and Posterior Tibial artery pedal pulses are 2/4 bilateral with immedate capillary fill  time.  There is no pain with calf compression, swelling, warmth, erythema.   Neruologic: Grossly intact via light touch bilateral.   Musculoskeletal: There is tenderness palpation of the right second MTPJ with localized edema but there is no erythema or warmth.  Hammertoes present as well as bunion deformity.  There is no area pinpoint tenderness identified at this time.  MMT 5/5.  Gait: Unassisted, Nonantalgic.    No images are attached to the encounter.    Results          Assessment:   1. Capsulitis of metatarsophalangeal (MTP) joint of right foot   2. Hammer toe of right foot   3. Bunion      Plan:  Patient was evaluated and treated and all questions answered.  Assessment and Plan          Hammertoe with associated joint inflammation - Prescribed Celebrex for inflammation management - Advised icing the area to reduce inflammation. - Recommended shoes with good arch support and wide toe box. - Suggested avoiding flat shoes, opting for supportive sandals or flip-flops. - Provided metatarsal pads to offload joint pressure. - Instructed on toe exercises to strengthen muscles and prevent progression.  Bunion - Advised wearing shoes with good arch support and soft material. - Recommended avoiding narrow shoes for prolonged periods.  Radiology X-rays obtained reviewed of the right foot.  Significant bunions present as well as with digital contracture present.  No  evidence of acute fracture.   Return if symptoms worsen or fail to improve.   Vivi Barrack DPM

## 2023-12-21 ENCOUNTER — Other Ambulatory Visit: Payer: Self-pay | Admitting: Podiatry

## 2024-03-24 ENCOUNTER — Encounter (INDEPENDENT_AMBULATORY_CARE_PROVIDER_SITE_OTHER): Payer: Self-pay | Admitting: *Deleted

## 2024-09-28 ENCOUNTER — Encounter (INDEPENDENT_AMBULATORY_CARE_PROVIDER_SITE_OTHER): Payer: Self-pay | Admitting: *Deleted
